# Patient Record
Sex: Male | Born: 1965 | Race: White | Hispanic: No | Marital: Single
Health system: Southern US, Community
[De-identification: ages and names within clinical notes are randomized; demographics above are authoritative.]

---

## 2001-06-23 ENCOUNTER — Encounter: Payer: Self-pay | Admitting: Emergency Medicine

## 2001-06-23 ENCOUNTER — Emergency Department (HOSPITAL_COMMUNITY): Admission: EM | Admit: 2001-06-23 | Discharge: 2001-06-23 | Payer: Self-pay | Admitting: Emergency Medicine

## 2002-05-27 ENCOUNTER — Encounter: Admission: RE | Admit: 2002-05-27 | Discharge: 2002-05-27 | Payer: Self-pay | Admitting: Internal Medicine

## 2002-05-27 ENCOUNTER — Encounter: Payer: Self-pay | Admitting: Internal Medicine

## 2006-12-18 ENCOUNTER — Emergency Department (HOSPITAL_COMMUNITY): Admission: EM | Admit: 2006-12-18 | Discharge: 2006-12-19 | Payer: Self-pay | Admitting: Emergency Medicine

## 2007-07-05 ENCOUNTER — Emergency Department (HOSPITAL_COMMUNITY): Admission: EM | Admit: 2007-07-05 | Discharge: 2007-07-05 | Payer: Self-pay | Admitting: Emergency Medicine

## 2009-01-18 ENCOUNTER — Emergency Department (HOSPITAL_COMMUNITY): Admission: EM | Admit: 2009-01-18 | Discharge: 2009-01-18 | Payer: Self-pay | Admitting: Family Medicine

## 2011-02-06 ENCOUNTER — Inpatient Hospital Stay (INDEPENDENT_AMBULATORY_CARE_PROVIDER_SITE_OTHER)
Admission: RE | Admit: 2011-02-06 | Discharge: 2011-02-06 | Disposition: A | Discharge status: Home / Self Care | Source: Ambulatory Visit | Attending: Family Medicine | Admitting: Family Medicine

## 2011-02-06 DIAGNOSIS — R6889 Other general symptoms and signs: Secondary | ICD-10-CM

## 2011-02-06 DIAGNOSIS — E875 Hyperkalemia: Secondary | ICD-10-CM

## 2011-02-06 LAB — POCT I-STAT, CHEM 8
BUN: 9 mg/dL (ref 6–23)
Calcium, Ion: 1.13 mmol/L (ref 1.12–1.32)
Chloride: 107 mEq/L (ref 96–112)
Creatinine, Ser: 1.1 mg/dL (ref 0.4–1.5)
Glucose, Bld: 102 mg/dL — ABNORMAL HIGH (ref 70–99)
HCT: 46 % (ref 39.0–52.0)
Hemoglobin: 15.6 g/dL (ref 13.0–17.0)
Potassium: 5.6 mEq/L — ABNORMAL HIGH (ref 3.5–5.1)
Sodium: 142 mEq/L (ref 135–145)
TCO2: 26 mmol/L (ref 0–100)

## 2011-02-14 ENCOUNTER — Emergency Department (HOSPITAL_COMMUNITY)
Admission: EM | Admit: 2011-02-14 | Discharge: 2011-02-14 | Disposition: A | Discharge status: Home / Self Care | Attending: Emergency Medicine | Admitting: Emergency Medicine

## 2011-02-14 ENCOUNTER — Emergency Department (HOSPITAL_COMMUNITY)

## 2011-02-14 DIAGNOSIS — R079 Chest pain, unspecified: Secondary | ICD-10-CM | POA: Insufficient documentation

## 2011-02-14 DIAGNOSIS — R0602 Shortness of breath: Secondary | ICD-10-CM | POA: Insufficient documentation

## 2011-02-14 DIAGNOSIS — F319 Bipolar disorder, unspecified: Secondary | ICD-10-CM | POA: Insufficient documentation

## 2011-02-14 DIAGNOSIS — R350 Frequency of micturition: Secondary | ICD-10-CM | POA: Insufficient documentation

## 2011-02-14 DIAGNOSIS — Z79899 Other long term (current) drug therapy: Secondary | ICD-10-CM | POA: Insufficient documentation

## 2011-02-14 LAB — POCT CARDIAC MARKERS
CKMB, poc: <1 ng/mL — ABNORMAL LOW (ref 1.0–8.0)
Myoglobin, poc: 45.4 ng/mL (ref 12–200)
Myoglobin, poc: 49.4 ng/mL (ref 12–200)

## 2011-02-14 LAB — DIFFERENTIAL
Basophils Absolute: 0 10*3/uL (ref 0.0–0.1)
Basophils Relative: 0 % (ref 0–1)
Eosinophils Absolute: 0.2 10*3/uL (ref 0.0–0.7)
Eosinophils Relative: 3 % (ref 0–5)
Lymphocytes Relative: 30 % (ref 12–46)
Lymphs Abs: 1.9 10*3/uL (ref 0.7–4.0)
Monocytes Absolute: 0.4 10*3/uL (ref 0.1–1.0)
Monocytes Relative: 6 % (ref 3–12)
Neutro Abs: 3.9 10*3/uL (ref 1.7–7.7)
Neutrophils Relative %: 60 % (ref 43–77)

## 2011-02-14 LAB — URINALYSIS, ROUTINE W REFLEX MICROSCOPIC
Bilirubin Urine: NEGATIVE
Hgb urine dipstick: NEGATIVE
Specific Gravity, Urine: 1.002 — ABNORMAL LOW (ref 1.005–1.030)
Urobilinogen, UA: 0.2 mg/dL (ref 0.0–1.0)

## 2011-02-14 LAB — CBC
MCHC: 35.4 g/dL (ref 30.0–36.0)
Platelets: 165 10*3/uL (ref 150–400)
RDW: 12.5 % (ref 11.5–15.5)

## 2011-02-14 LAB — BASIC METABOLIC PANEL
BUN: 14 mg/dL (ref 6–23)
CO2: 29 mEq/L (ref 19–32)
Calcium: 9.9 mg/dL (ref 8.4–10.5)
Glucose, Bld: 89 mg/dL (ref 70–99)
Sodium: 139 mEq/L (ref 135–145)

## 2011-03-28 ENCOUNTER — Emergency Department (HOSPITAL_COMMUNITY)
Admission: EM | Admit: 2011-03-28 | Discharge: 2011-03-28 | Disposition: A | Discharge status: Other Acute Inpatient Hospital | Payer: Non-veteran care | Attending: Emergency Medicine | Admitting: Emergency Medicine

## 2011-03-28 DIAGNOSIS — R4182 Altered mental status, unspecified: Secondary | ICD-10-CM | POA: Insufficient documentation

## 2011-03-28 DIAGNOSIS — F319 Bipolar disorder, unspecified: Secondary | ICD-10-CM | POA: Insufficient documentation

## 2011-03-28 LAB — URINALYSIS, ROUTINE W REFLEX MICROSCOPIC
Bilirubin Urine: NEGATIVE
Glucose, UA: NEGATIVE mg/dL
Ketones, ur: NEGATIVE mg/dL
Leukocytes, UA: NEGATIVE
pH: 5.5 (ref 5.0–8.0)

## 2011-03-28 LAB — DIFFERENTIAL
Basophils Absolute: 0 10*3/uL (ref 0.0–0.1)
Basophils Relative: 0 % (ref 0–1)
Monocytes Relative: 5 % (ref 3–12)
Neutro Abs: 4 10*3/uL (ref 1.7–7.7)
Neutrophils Relative %: 69 % (ref 43–77)

## 2011-03-28 LAB — COMPREHENSIVE METABOLIC PANEL
ALT: 10 U/L (ref 0–53)
AST: 15 U/L (ref 0–37)
CO2: 31 mEq/L (ref 19–32)
Chloride: 101 mEq/L (ref 96–112)
GFR calc non Af Amer: >60 mL/min (ref >60)
Potassium: 3.5 mEq/L (ref 3.5–5.1)
Sodium: 141 mEq/L (ref 135–145)
Total Bilirubin: 1.1 mg/dL (ref 0.3–1.2)

## 2011-03-28 LAB — CBC
Hemoglobin: 14.7 g/dL (ref 13.0–17.0)
RBC: 4.9 MIL/uL (ref 4.22–5.81)

## 2011-03-28 LAB — RAPID URINE DRUG SCREEN, HOSP PERFORMED
Amphetamines: NOT DETECTED
Barbiturates: NOT DETECTED
Benzodiazepines: NOT DETECTED
Cocaine: NOT DETECTED

## 2011-03-28 LAB — ACETAMINOPHEN LEVEL: Acetaminophen (Tylenol), Serum: <15 ug/mL (ref 10–30)

## 2013-04-09 IMAGING — CR DG CHEST 2V
2 series · 2 of 2 positions shown · non-contrast
Comparison: Chest radiograph performed 12/18/2006

CLINICAL DATA: Mid chest pain.

CHEST - 2 VIEW

[w chest pa]
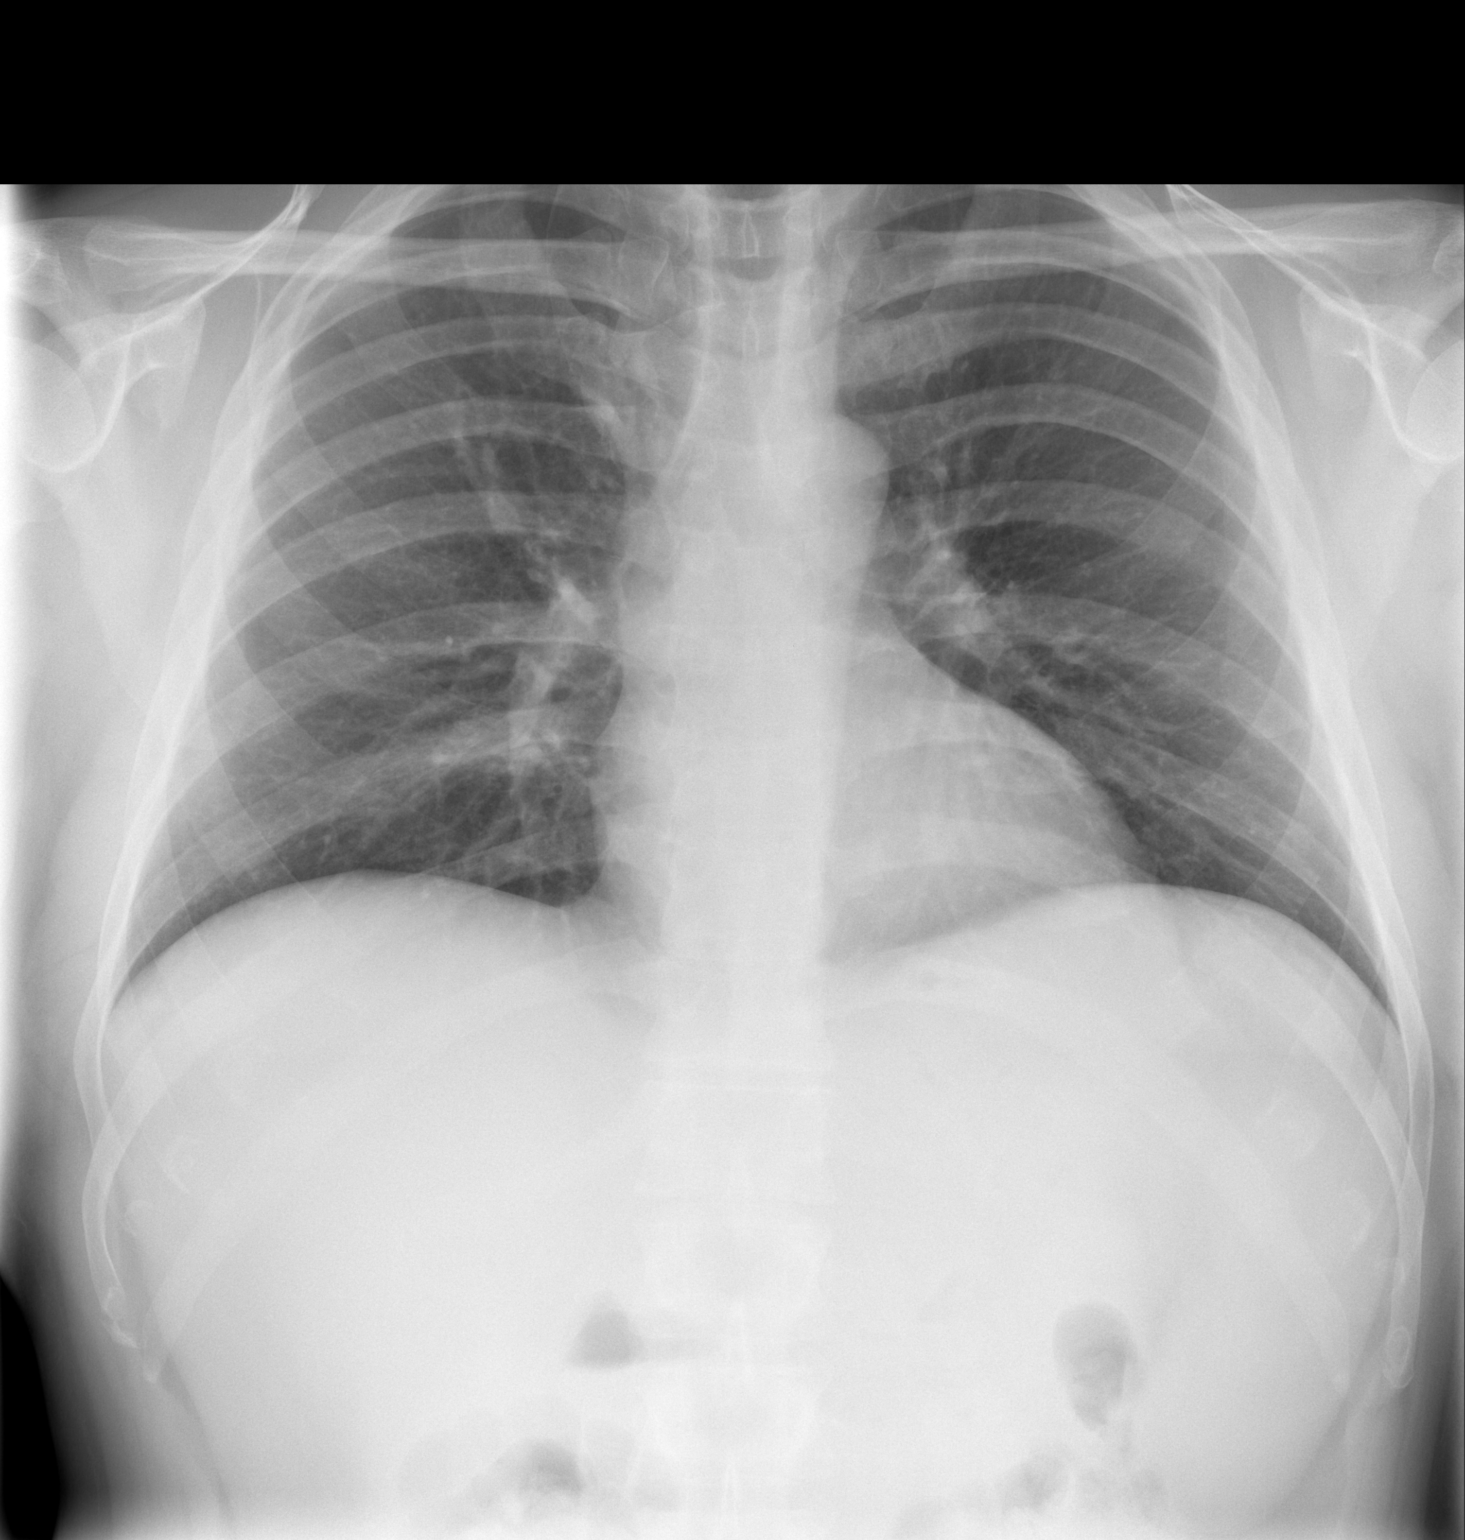

[w chest lat]
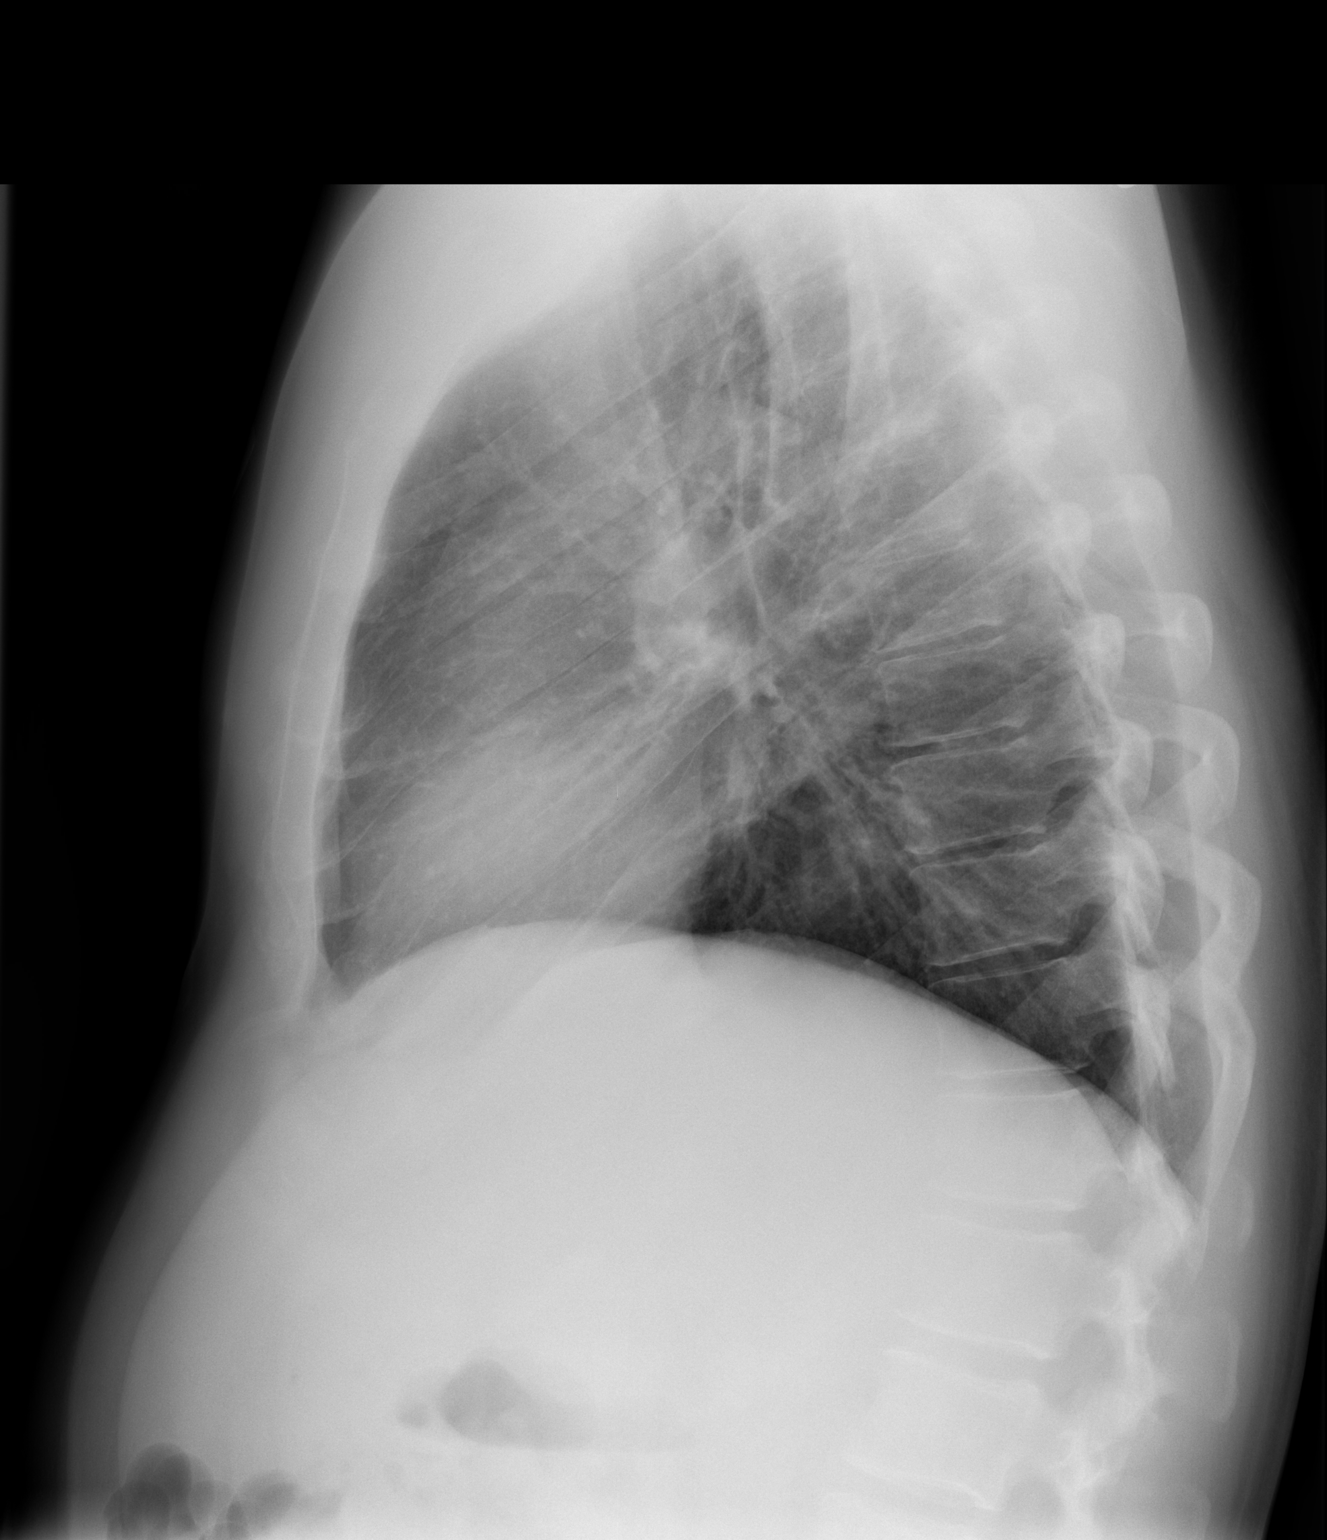

[2 of 2 positions shown; findings below may reference images not displayed]

FINDINGS: The lungs are well-aerated and clear.  There is no
evidence of focal opacification, pleural effusion or pneumothorax.
A small accessory azygos lobe is incidentally noted.

The heart is normal in size; the mediastinal contour is within
normal limits.  No acute osseous abnormalities are seen.
IMPRESSION: No acute cardiopulmonary process seen.

## 2015-10-12 ENCOUNTER — Encounter (HOSPITAL_COMMUNITY): Payer: Self-pay | Admitting: Emergency Medicine

## 2015-10-12 ENCOUNTER — Emergency Department (HOSPITAL_COMMUNITY)
Admission: EM | Admit: 2015-10-12 | Discharge: 2015-10-13 | Disposition: A | Discharge status: Home / Self Care | Payer: Non-veteran care | Attending: Emergency Medicine | Admitting: Emergency Medicine

## 2015-10-12 DIAGNOSIS — F3112 Bipolar disorder, current episode manic without psychotic features, moderate: Secondary | ICD-10-CM | POA: Diagnosis present

## 2015-10-12 DIAGNOSIS — Z79899 Other long term (current) drug therapy: Secondary | ICD-10-CM | POA: Diagnosis not present

## 2015-10-12 DIAGNOSIS — F309 Manic episode, unspecified: Secondary | ICD-10-CM | POA: Diagnosis not present

## 2015-10-12 DIAGNOSIS — Z008 Encounter for other general examination: Secondary | ICD-10-CM | POA: Diagnosis present

## 2015-10-12 LAB — COMPREHENSIVE METABOLIC PANEL
ALT: 21 U/L (ref 17–63)
ANION GAP: 9 (ref 5–15)
AST: 30 U/L (ref 15–41)
Albumin: 4.5 g/dL (ref 3.5–5.0)
Alkaline Phosphatase: 71 U/L (ref 38–126)
BILIRUBIN TOTAL: 1 mg/dL (ref 0.3–1.2)
BUN: 15 mg/dL (ref 6–20)
CHLORIDE: 103 mmol/L (ref 101–111)
CO2: 29 mmol/L (ref 22–32)
Calcium: 9.3 mg/dL (ref 8.9–10.3)
Creatinine, Ser: 1.03 mg/dL (ref 0.61–1.24)
Glucose, Bld: 100 mg/dL — ABNORMAL HIGH (ref 65–99)
POTASSIUM: 4.1 mmol/L (ref 3.5–5.1)
Sodium: 141 mmol/L (ref 135–145)
TOTAL PROTEIN: 7.1 g/dL (ref 6.5–8.1)

## 2015-10-12 LAB — CBC WITH DIFFERENTIAL/PLATELET
Basophils Absolute: 0 10*3/uL (ref 0.0–0.1)
Basophils Relative: 0 %
EOS PCT: 2 %
Eosinophils Absolute: 0.1 10*3/uL (ref 0.0–0.7)
HEMATOCRIT: 43.4 % (ref 39.0–52.0)
Hemoglobin: 14.6 g/dL (ref 13.0–17.0)
LYMPHS PCT: 32 %
Lymphs Abs: 1.4 10*3/uL (ref 0.7–4.0)
MCH: 29.3 pg (ref 26.0–34.0)
MCHC: 33.6 g/dL (ref 30.0–36.0)
MCV: 87 fL (ref 78.0–100.0)
MONO ABS: 0.3 10*3/uL (ref 0.1–1.0)
MONOS PCT: 6 %
NEUTROS ABS: 2.6 10*3/uL (ref 1.7–7.7)
Neutrophils Relative %: 60 %
PLATELETS: 128 10*3/uL — AB (ref 150–400)
RBC: 4.99 MIL/uL (ref 4.22–5.81)
RDW: 13.1 % (ref 11.5–15.5)
WBC: 4.4 10*3/uL (ref 4.0–10.5)

## 2015-10-12 LAB — RAPID URINE DRUG SCREEN, HOSP PERFORMED
Amphetamines: NOT DETECTED
BENZODIAZEPINES: NOT DETECTED
Barbiturates: NOT DETECTED
Cocaine: NOT DETECTED
OPIATES: NOT DETECTED
Tetrahydrocannabinol: NOT DETECTED

## 2015-10-12 LAB — ETHANOL

## 2015-10-12 MED ORDER — ALUM & MAG HYDROXIDE-SIMETH 200-200-20 MG/5ML PO SUSP: 30.0000 mL | ORAL | Status: DC | PRN

## 2015-10-12 MED ORDER — QUETIAPINE FUMARATE 100 MG PO TABS
200.0000 mg | ORAL_TABLET | Freq: Every day | ORAL | Status: DC
  Administered 2015-10-12: 200 mg via ORAL
  Filled 2015-10-12: qty 2

## 2015-10-12 MED ORDER — LORAZEPAM 1 MG PO TABS
1.0000 mg | ORAL_TABLET | Freq: Three times a day (TID) | ORAL | Status: DC | PRN
  Administered 2015-10-12: 1 mg via ORAL
  Filled 2015-10-12: qty 1

## 2015-10-12 MED ORDER — ZOLPIDEM TARTRATE 5 MG PO TABS: 5.0000 mg | ORAL_TABLET | Freq: Every evening | ORAL | Status: DC | PRN

## 2015-10-12 MED ORDER — NICOTINE 21 MG/24HR TD PT24
21.0000 mg | MEDICATED_PATCH | Freq: Every day | TRANSDERMAL | Status: DC
  Filled 2015-10-12 (×2): qty 1

## 2015-10-12 MED ORDER — CLONAZEPAM 0.5 MG PO TABS
0.5000 mg | ORAL_TABLET | Freq: Every day | ORAL | Status: DC
  Administered 2015-10-12: 0.5 mg via ORAL
  Filled 2015-10-12: qty 1

## 2015-10-12 MED ORDER — ONDANSETRON HCL 4 MG PO TABS: 4.0000 mg | ORAL_TABLET | Freq: Three times a day (TID) | ORAL | Status: DC | PRN

## 2015-10-12 NOTE — ED Notes (Signed)
Per EMS: Pt was standing on the corner of the street without any pants.  A&O x 4. Has stopped taking seroquel.  Requesting his meds be adjusted.

## 2015-10-12 NOTE — BH Assessment (Signed)
B[facility] Assessment Progress Note  The following facilities have been contacted to seek placement for this pt, with results as noted:  Beds available, information sent, decision pending:  [facility]   At capacity:  [facility]   Yordin Rhoda, MA Triage Specialist [phone]     

## 2015-10-12 NOTE — BH Assessment (Signed)
Pending review for possible placement with [facility].  Writer called [facility] ([phone]) to check the status of their bed availability. Was informed by the AOD they were NOT on diversion. Thus, patient can not be placed with [facility] at this time.  Writer informed [facility] ER Staff Julieanne Cotton, NP)

## 2015-10-12 NOTE — Consult Note (Signed)
[facility] Face-to-Face Psychiatry Consult   Reason for Consult:  Bizarre behavior, manic symptoms Referring Physician:  EDP Patient Identification: Andrew Schroeder MRN:  [id number] Principal Diagnosis: Bipolar 1 disorder, manic, moderate ([place]) Diagnosis:   Patient Active Problem List   Diagnosis Date Noted  . Bipolar 1 disorder, manic, moderate ([facility]) [F31.12] 10/12/2015    Priority: High    Total Time spent with patient: 45 minutes  Subjective:   Andrew Schroeder is a 50 y.o. male patient admitted with  Bizarre behavior, manic symptoms  HPI:  Caucasian male, 50 years old was evaluated today after he was brought in by EMS.  Patient was picked up by EMS when he was seen standing on the street with his pant down.  Patient admitted that his pant was down when he was found but could not recall why he was naked.  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  He reports that he has not had his medications in 2 weeks because he was moving from [place] to [place].  Patient reports poor sleep since not taking his medications.  Patient reports that he has been engaging in "grounding therapy" for his Bipolar treatment.  He denies SI/HI/AVH.  He has been accepted for admission and we will be seeking placement at any facility with available bed.  Past Psychiatric History: Bipolar disorder  Risk to Self:   Risk to Others:   Prior Inpatient Therapy:   Prior Outpatient Therapy:    Past Medical History: History reviewed. No pertinent past medical history. No past surgical history on file. Family History: No family history on file.   Family Psychiatric  History:  Unknown Social History:  History  Alcohol Use No     History  Drug Use Not on file    Social History   Social History  . Marital Status: Single    Spouse Name: N/A  . Number of Children: N/A  . Years of Education: N/A   Social History Main Topics  . Smoking status: None  . Smokeless  tobacco: None  . Alcohol Use: No  . Drug Use: None  . Sexual Activity: Not Asked   Other Topics Concern  . None   Social History Narrative  . None   Additional Social History:  Allergies:  No Known Allergies  Labs:  Results for orders placed or performed during the hospital encounter of 10/12/15 (from the past 48 hour(s))  Urine rapid drug screen (hosp performed)     Status: None   Collection Time: 10/12/15  9:43 AM  Result Value Ref Range   Opiates NONE DETECTED NONE DETECTED   Cocaine NONE DETECTED NONE DETECTED   Benzodiazepines NONE DETECTED NONE DETECTED   Amphetamines NONE DETECTED NONE DETECTED   Tetrahydrocannabinol NONE DETECTED NONE DETECTED   Barbiturates NONE DETECTED NONE DETECTED    Comment:        DRUG SCREEN FOR MEDICAL PURPOSES ONLY.  IF CONFIRMATION IS NEEDED FOR ANY PURPOSE, NOTIFY LAB WITHIN 5 DAYS.        LOWEST DETECTABLE LIMITS FOR URINE DRUG SCREEN Drug Class       Cutoff (ng/mL) Amphetamine      1000 Barbiturate      200 Benzodiazepine   183 Tricyclics       437 Opiates          300 Cocaine          300 THC  50   CBC with Differential/Platelet     Status: Abnormal   Collection Time: 10/12/15  9:46 AM  Result Value Ref Range   WBC 4.4 4.0 - 10.5 K/uL   RBC 4.99 4.22 - 5.81 MIL/uL   Hemoglobin 14.6 13.0 - 17.0 g/dL   HCT 43.4 39.0 - 52.0 %   MCV 87.0 78.0 - 100.0 fL   MCH 29.3 26.0 - 34.0 pg   MCHC 33.6 30.0 - 36.0 g/dL   RDW 13.1 11.5 - 15.5 %   Platelets 128 (L) 150 - 400 K/uL   Neutrophils Relative % 60 %   Neutro Abs 2.6 1.7 - 7.7 K/uL   Lymphocytes Relative 32 %   Lymphs Abs 1.4 0.7 - 4.0 K/uL   Monocytes Relative 6 %   Monocytes Absolute 0.3 0.1 - 1.0 K/uL   Eosinophils Relative 2 %   Eosinophils Absolute 0.1 0.0 - 0.7 K/uL   Basophils Relative 0 %   Basophils Absolute 0.0 0.0 - 0.1 K/uL  Comprehensive metabolic panel     Status: Abnormal   Collection Time: 10/12/15  9:46 AM  Result Value Ref Range   Sodium  141 135 - 145 mmol/L   Potassium 4.1 3.5 - 5.1 mmol/L   Chloride 103 101 - 111 mmol/L   CO2 29 22 - 32 mmol/L   Glucose, Bld 100 (H) 65 - 99 mg/dL   BUN 15 6 - 20 mg/dL   Creatinine, Ser 1.03 0.61 - 1.24 mg/dL   Calcium 9.3 8.9 - 10.3 mg/dL   Total Protein 7.1 6.5 - 8.1 g/dL   Albumin 4.5 3.5 - 5.0 g/dL   AST 30 15 - 41 U/L   ALT 21 17 - 63 U/L   Alkaline Phosphatase 71 38 - 126 U/L   Total Bilirubin 1.0 0.3 - 1.2 mg/dL   GFR calc non Af Amer >60 >60 mL/min   GFR calc Af Amer >60 >60 mL/min    Comment: (NOTE) The eGFR has been calculated using the CKD EPI equation. This calculation has not been validated in all clinical situations. eGFR's persistently <60 mL/min signify possible Chronic Kidney Disease.    Anion gap 9 5 - 15  Ethanol     Status: None   Collection Time: 10/12/15  9:46 AM  Result Value Ref Range   Alcohol, Ethyl (B) <5 <5 mg/dL    Comment:        LOWEST DETECTABLE LIMIT FOR SERUM ALCOHOL IS 5 mg/dL FOR MEDICAL PURPOSES ONLY     Current Facility-Administered Medications  Medication Dose Route Frequency Provider Last Rate Last Dose  . alum & mag hydroxide-simeth (MAALOX/MYLANTA) 200-200-20 MG/5ML suspension 30 mL  30 mL Oral PRN Lacretia Leigh, MD      . clonazePAM [facility]) tablet 0.5 mg  0.5 mg Oral QHS Xareni Kelch      . LORazepam (ATIVAN) tablet 1 mg  1 mg Oral Q8H PRN Lacretia Leigh, MD      . nicotine (NICODERM CQ - dosed in mg/24 hours) patch 21 mg  21 mg Transdermal Daily Lacretia Leigh, MD   21 mg at 10/12/15 1232  . ondansetron (ZOFRAN) tablet 4 mg  4 mg Oral Q8H PRN Lacretia Leigh, MD      . QUEtiapine (SEROQUEL) tablet 200 mg  200 mg Oral QHS Racer Quam       Current Outpatient Prescriptions  Medication Sig Dispense Refill  . clonazePAM (KLONOPIN) 0.5 MG tablet Take 0.5-1 mg by mouth at bedtime  as needed for anxiety.    Marland Kitchen QUEtiapine (SEROQUEL XR) 200 MG 24 hr tablet Take 200 mg by mouth at bedtime.      Musculoskeletal: Strength & Muscle  Tone: within normal limits Gait & Station: normal Patient leans: N/A  Psychiatric Specialty Exam: Review of Systems  Constitutional: Negative.   HENT: Negative.   Eyes: Negative.   Respiratory: Negative.   Cardiovascular: Negative.   Gastrointestinal: Negative.   Genitourinary: Negative.   Musculoskeletal: Negative.   Skin: Negative.   Neurological: Negative.   Endo/Heme/Allergies: Negative.     Blood pressure 131/69, pulse 77, temperature 98 F (36.7 C), temperature source Oral, resp. rate 18, SpO2 100 %.There is no height or weight on file to calculate BMI.  General Appearance: Casual and Fairly Groomed  Engineer, water::  Good  Speech:  Clear and Coherent and Normal Rate  Volume:  Normal  Mood:  Anxious  Affect:  Congruent  Thought Process:  Coherent, Goal Directed and Intact  Orientation:  Full (Time, Place, and Person)  Thought Content:  WDL  Suicidal Thoughts:  No  Homicidal Thoughts:  No  Memory:  Immediate;   Good Recent;   Good Remote;   Good  Judgement:  Fair  Insight:  Fair  Psychomotor Activity:  Normal  Concentration:  Good  Recall:  Icard of Knowledge:Fair  Language: Good  Akathisia:  No  Handed:  Right  AIMS (if indicated):     Assets:  Desire for Improvement  ADL's:  Intact  Cognition: WNL  Sleep:       Disposition: Accepted for admission and we will be seeking placement at any facility with available bed.  We have resumed his home medications.  Delfin Gant   PMHNP-BC 10/12/2015 2:09 PM Patient seen face-to-face for psychiatric evaluation, chart reviewed and case discussed with the physician extender and developed treatment plan. Reviewed the information documented and agree with the treatment plan. Corena Pilgrim, MD

## 2015-10-12 NOTE — BH Assessment (Signed)
BHH Assessment Progress Note  Per Thedore Mins, MD, this pt requires psychiatric hospitalization at this time.  Pt is a veteran and reports that he has been to the [facility] in the past.  At 12:03 I spoke to Hoy Register [phone]) at the [facility].  After taking demographic information, he reports that pt will be placed on their wait list and that they will call when a bed becomes available.  At this time they will not accept a referral packet by fax.  Doylene Canning, MA Triage Specialist [phone]

## 2015-10-12 NOTE — ED Notes (Signed)
Patient appears euphoric. Denies complaint. Refers to questions of anxiety and depression as "Western problems" States [place]. Denies SI, HI, AVH. Denies any anxiety or depression. Reports no recent changes in sleep or appetite.

## 2015-10-12 NOTE — Progress Notes (Signed)
Pt is a 50 y/o Caucasian male admitted to [facility] with h/o of Bipolar D/O. Per chart and report, pt was brought in by EMS after he was found naked, standing by a telephone pole. Pt was cooperative with initial nursing assessment. Denied SI, HI, AVH and pain when assessed. Reported medication noncompliance with Seroquel. Per pt, he has not taken his medication because he's been traveling ([place] Pt also stated to writer that he's been sleeping 3 hours on average per night as he's been out of his Klonopin as well.Safety maintained on Q 15 minutes checks as ordered without self injurious behavior or outburst to report at this time.

## 2015-10-12 NOTE — ED Notes (Signed)
Waiting to draw blood, pt in bathroom

## 2015-10-12 NOTE — Progress Notes (Signed)
Patient listed as having VA benefits without a pcp.  EDCM spoke to patient at bedside.  When [facility] questioned patient if he associated with the VA, patient responded, "Well, I've been in the [place] for the past three years so not right now."  Patient reports he was seen at the [facility] four years ago.  EDCM offered patient community resources for assistance finding a pcp or medication assistance, but patient refused.  Patient reports he will follow up with his pcp Dr. Geanie Cooley? At [facility].  Patient also asked EDCM how he would get a visiting RN through the [place].  [facility]  Informed patient that he must first make an appointment with his pcp at the [place] and then ask to speak to a case manager.  Patient is aware that he may not qualify for home health RN.  Patient thanked [facility] for services.  No further EDCM needs at this time.

## 2015-10-12 NOTE — ED Notes (Signed)
Pt states that [place] has been living [place] the past 3 years.  States that his rx of seroquel ran out within the last 2 months.  States that he was standing out on the corner without any pants.  Hx of bipolar.  Just wants rx for seroquel.

## 2015-10-12 NOTE — ED Provider Notes (Signed)
CSN: [id number]     Arrival date & time 10/12/15  0844 History   First MD Initiated Contact with Patient 10/12/15 [phone]     Chief Complaint  Patient presents with  . Medical Clearance     (Consider location/radiation/quality/duration/timing/severity/associated sxs/prior Treatment) HPI Comments: Patient here after being found outside standing by telephone pole without any pants on. Patient denies any auditory or visual hallucinations. States that he does have a history of bipolar disorder and has been out of his Seroquel and has been experiencing more mania. He has tried self calming techniques which have not helped. Denies any suicidal or homicidal ideations. No somatic complaints at this time. States he did take 2 Klonopin because he thought he was comfortable going to sleep. Spoke with EMS, when they arrived he was calm alert and cooperative. Patient transported here for further management  The history is provided by the patient.    History reviewed. No pertinent past medical history. No past surgical history on file. No family history on file. Social History  Substance Use Topics  . Smoking status: None  . Smokeless tobacco: None  . Alcohol Use: No    Review of Systems  All other systems reviewed and are negative.     Allergies  Review of patient's allergies indicates no known allergies.  Home Medications   Prior to Admission medications   Medication Sig Start Date End Date Taking? Authorizing Provider  QUEtiapine (SEROQUEL XR) 200 MG 24 hr tablet Take 200 mg by mouth at bedtime.   Yes Historical Provider, MD   BP 140/83 mmHg  Pulse 72  Temp(Src) 97.9 F (36.6 C) (Oral)  Resp 20  SpO2 100% Physical Exam  Constitutional: He is oriented to person, place, and time. He appears well-developed and well-nourished.  Non-toxic appearance. No distress.  HENT:  Head: Normocephalic and atraumatic.  Eyes: Conjunctivae, EOM and lids are normal. Pupils are equal, round, and  reactive to light.  Neck: Normal range of motion. Neck supple. No tracheal deviation present. No thyroid mass present.  Cardiovascular: Normal rate, regular rhythm and normal heart sounds.  Exam reveals no gallop.   No murmur heard. Pulmonary/Chest: Effort normal and breath sounds normal. No stridor. No respiratory distress. He has no decreased breath sounds. He has no wheezes. He has no rhonchi. He has no rales.  Abdominal: Soft. Normal appearance and bowel sounds are normal. He exhibits no distension. There is no tenderness. There is no rebound and no CVA tenderness.  Musculoskeletal: Normal range of motion. He exhibits no edema or tenderness.  Neurological: He is alert and oriented to person, place, and time. He has normal strength. No cranial nerve deficit or sensory deficit. GCS eye subscore is 4. GCS verbal subscore is 5. GCS motor subscore is 6.  Skin: Skin is warm and dry. No abrasion and no rash noted.  Psychiatric: His speech is normal and behavior is normal. His affect is blunt. Thought content is not delusional. He expresses no suicidal plans and no homicidal plans.  Nursing note and vitals reviewed.   ED Course  Procedures (including critical care time) Labs Review Labs Reviewed  CBC WITH DIFFERENTIAL/PLATELET  COMPREHENSIVE METABOLIC PANEL  ETHANOL  URINE RAPID DRUG SCREEN, HOSP PERFORMED    Imaging Review No results found. I have personally reviewed and evaluated these images and lab results as part of my medical decision-making.   EKG Interpretation None      MDM   Final diagnoses:  None    Patient  has been seen by psychiatry and has agreed to come in for mood stabilization    Lorre Nick, MD 10/12/15 1046

## 2015-10-13 MED ORDER — QUETIAPINE FUMARATE ER 200 MG PO TB24: 200.0000 mg | ORAL_TABLET | Freq: Every day | ORAL | Status: AC

## 2015-10-13 MED ORDER — QUETIAPINE FUMARATE ER 200 MG PO TB24: 200.0000 mg | ORAL_TABLET | Freq: Every day | ORAL | Status: DC

## 2015-10-13 MED ORDER — CLONAZEPAM 0.5 MG PO TABS: 0.5000 mg | ORAL_TABLET | Freq: Every day | ORAL | Status: AC

## 2015-10-13 NOTE — BH Assessment (Signed)
BHH Assessment Progress Note  Per Thedore Mins, MD, this pt does not require psychiatric hospitalization at this time.  Pt is to be discharged from [facility] with referral information for the [facility] clinic.  This information has been included in pt's discharge instructions.  Pt's nurse, Dawnaly, has been notified.  Doylene Canning, MA Triage Specialist [phone]

## 2015-10-13 NOTE — ED Notes (Signed)
MD at bedside. 

## 2015-10-13 NOTE — Consult Note (Signed)
[facility] Face-to-Face Psychiatry Consult   Reason for Consult:  Bizarre behavior, manic symptoms Referring Physician:  EDP Patient Identification: Andrew Schroeder MRN:  [id number] Principal Diagnosis: Bipolar 1 disorder, manic, moderate ([place]) Diagnosis:   Patient Active Problem List   Diagnosis Date Noted  . Bipolar 1 disorder, manic, moderate ([place]) [F31.12] 10/12/2015    Priority: High    Total Time spent with patient: 45 minutes  Subjective:   Andrew Schroeder is a 50 y.o. male patient admitted with  Bizarre behavior, manic symptoms. Pt spent the night in the ED without incident and has been appropriate. Denies suicidal/homicidal ideation and psychosis and does not appear to be responding to internal stimuli. Pt seen and chart reviewed by NP/MD team and note written on behalf of Dr. Darleene Cleaver. Pt deemed stable for discharge today.   HPI:  Caucasian male, 50 years old was evaluated today after he was brought in by EMS.  Patient was picked up by EMS when he was seen standing on the street with his pant down.  Patient admitted that his pant was down when he was found but could not recall why he was naked.  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  He reports that he has not had his medications in 2 weeks because he was moving from [place] to [place].  Patient reports poor sleep since not taking his medications.  Patient reports that he has been engaging in "grounding therapy" for his Bipolar treatment.  He denies SI/HI/AVH.  He has been accepted for admission and we will be seeking placement at any facility with available bed.  Past Psychiatric History: Bipolar disorder  Risk to Self:   Risk to Others:   Prior Inpatient Therapy:   Prior Outpatient Therapy:    Past Medical History: History reviewed. No pertinent past medical history. No past surgical history on file. Family History: No family history on file.   Family Psychiatric  History:   Unknown Social History:  History  Alcohol Use No     History  Drug Use Not on file    Social History   Social History  . Marital Status: Single    Spouse Name: N/A  . Number of Children: N/A  . Years of Education: N/A   Social History Main Topics  . Smoking status: None  . Smokeless tobacco: None  . Alcohol Use: No  . Drug Use: None  . Sexual Activity: Not Asked   Other Topics Concern  . None   Social History Narrative  . None   Additional Social History:  Allergies:  No Known Allergies  Labs:  Results for orders placed or performed during the hospital encounter of 10/12/15 (from the past 48 hour(s))  Urine rapid drug screen (hosp performed)     Status: None   Collection Time: 10/12/15  9:43 AM  Result Value Ref Range   Opiates NONE DETECTED NONE DETECTED   Cocaine NONE DETECTED NONE DETECTED   Benzodiazepines NONE DETECTED NONE DETECTED   Amphetamines NONE DETECTED NONE DETECTED   Tetrahydrocannabinol NONE DETECTED NONE DETECTED   Barbiturates NONE DETECTED NONE DETECTED    Comment:        DRUG SCREEN FOR MEDICAL PURPOSES ONLY.  IF CONFIRMATION IS NEEDED FOR ANY PURPOSE, NOTIFY LAB WITHIN 5 DAYS.        LOWEST DETECTABLE LIMITS FOR URINE DRUG SCREEN Drug Class       Cutoff (ng/mL) Amphetamine  1000 Barbiturate      200 Benzodiazepine   240 Tricyclics       973 Opiates          300 Cocaine          300 THC              50   CBC with Differential/Platelet     Status: Abnormal   Collection Time: 10/12/15  9:46 AM  Result Value Ref Range   WBC 4.4 4.0 - 10.5 K/uL   RBC 4.99 4.22 - 5.81 MIL/uL   Hemoglobin 14.6 13.0 - 17.0 g/dL   HCT 43.4 39.0 - 52.0 %   MCV 87.0 78.0 - 100.0 fL   MCH 29.3 26.0 - 34.0 pg   MCHC 33.6 30.0 - 36.0 g/dL   RDW 13.1 11.5 - 15.5 %   Platelets 128 (L) 150 - 400 K/uL   Neutrophils Relative % 60 %   Neutro Abs 2.6 1.7 - 7.7 K/uL   Lymphocytes Relative 32 %   Lymphs Abs 1.4 0.7 - 4.0 K/uL   Monocytes Relative 6 %    Monocytes Absolute 0.3 0.1 - 1.0 K/uL   Eosinophils Relative 2 %   Eosinophils Absolute 0.1 0.0 - 0.7 K/uL   Basophils Relative 0 %   Basophils Absolute 0.0 0.0 - 0.1 K/uL  Comprehensive metabolic panel     Status: Abnormal   Collection Time: 10/12/15  9:46 AM  Result Value Ref Range   Sodium 141 135 - 145 mmol/L   Potassium 4.1 3.5 - 5.1 mmol/L   Chloride 103 101 - 111 mmol/L   CO2 29 22 - 32 mmol/L   Glucose, Bld 100 (H) 65 - 99 mg/dL   BUN 15 6 - 20 mg/dL   Creatinine, Ser 1.03 0.61 - 1.24 mg/dL   Calcium 9.3 8.9 - 10.3 mg/dL   Total Protein 7.1 6.5 - 8.1 g/dL   Albumin 4.5 3.5 - 5.0 g/dL   AST 30 15 - 41 U/L   ALT 21 17 - 63 U/L   Alkaline Phosphatase 71 38 - 126 U/L   Total Bilirubin 1.0 0.3 - 1.2 mg/dL   GFR calc non Af Amer >60 >60 mL/min   GFR calc Af Amer >60 >60 mL/min    Comment: (NOTE) The eGFR has been calculated using the CKD EPI equation. This calculation has not been validated in all clinical situations. eGFR's persistently <60 mL/min signify possible Chronic Kidney Disease.    Anion gap 9 5 - 15  Ethanol     Status: None   Collection Time: 10/12/15  9:46 AM  Result Value Ref Range   Alcohol, Ethyl (B) <5 <5 mg/dL    Comment:        LOWEST DETECTABLE LIMIT FOR SERUM ALCOHOL IS 5 mg/dL FOR MEDICAL PURPOSES ONLY     Current Facility-Administered Medications  Medication Dose Route Frequency Provider Last Rate Last Dose  . alum & mag hydroxide-simeth (MAALOX/MYLANTA) 200-200-20 MG/5ML suspension 30 mL  30 mL Oral PRN Lacretia Leigh, MD      . clonazePAM [facility]) tablet 0.5 mg  0.5 mg Oral QHS Keno Caraway   0.5 mg at 10/12/15 2151  . LORazepam (ATIVAN) tablet 1 mg  1 mg Oral Q8H PRN Lacretia Leigh, MD   1 mg at 10/12/15 1748  . ondansetron (ZOFRAN) tablet 4 mg  4 mg Oral Q8H PRN Lacretia Leigh, MD      . QUEtiapine (SEROQUEL) tablet 200 mg  200 mg Oral QHS Auriah Hollings   200 mg at 10/12/15 2151   Current Outpatient Prescriptions  Medication Sig  Dispense Refill  . clonazePAM (KLONOPIN) 0.5 MG tablet Take 0.5-1 mg by mouth at bedtime as needed for anxiety.    Marland Kitchen QUEtiapine (SEROQUEL XR) 200 MG 24 hr tablet Take 1 tablet (200 mg total) by mouth at bedtime.      Musculoskeletal: Strength & Muscle Tone: within normal limits Gait & Station: normal Patient leans: N/A  Psychiatric Specialty Exam: Review of Systems  Constitutional: Negative.   HENT: Negative.   Eyes: Negative.   Respiratory: Negative.   Cardiovascular: Negative.   Gastrointestinal: Negative.   Genitourinary: Negative.   Musculoskeletal: Negative.   Skin: Negative.   Neurological: Negative.   Endo/Heme/Allergies: Negative.   All other systems reviewed and are negative.   Blood pressure 140/73, pulse 79, temperature 98.4 F (36.9 C), temperature source Oral, resp. rate 20, SpO2 99 %.There is no height or weight on file to calculate BMI.  General Appearance: Casual and Fairly Groomed  Engineer, water::  Good  Speech:  Clear and Coherent and Normal Rate  Volume:  Normal  Mood:  Anxious  Affect:  Congruent  Thought Process:  Coherent, Goal Directed and Intact  Orientation:  Full (Time, Place, and Person)  Thought Content:  WDL  Suicidal Thoughts:  No  Homicidal Thoughts:  No  Memory:  Immediate;   Good Recent;   Good Remote;   Good  Judgement:  Fair  Insight:  Fair  Psychomotor Activity:  Normal  Concentration:  Good  Recall:  [place] of Knowledge:Fair  Language: Good  Akathisia:  No  Handed:  Right  AIMS (if indicated):     Assets:  Desire for Improvement  ADL's:  Intact  Cognition: WNL  Sleep:      Disposition:  -Discharge home with Rx for Clonazepam and Seroquel as listed on chart.   Benjamine Mola, FNP-BC 10/13/2015 12:39 PM  Patient seen face-to-face for psychiatric evaluation, chart reviewed and case discussed with the physician extender and developed treatment plan. Reviewed the information documented and agree with the treatment  plan. Corena Pilgrim, MD

## 2015-10-13 NOTE — Discharge Instructions (Signed)
For your ongoing behavioral health needs, you are advised to follow up with the [facility]:       [facility]      [address] [place], [place] [address]      [phone]

## 2015-10-13 NOTE — ED Notes (Signed)
Patient discharged to home.  Left the unit ambulatory with all belongings.  Denies thoughts of harm to self or others.  Denies auditory or visual hallucinations.

## 2015-10-13 NOTE — BH Assessment (Signed)
[facility] Assessment Progress Note  At 09:37 this Clinical research associate called the [facility] to follow up on yesterday's referral call that I placed.  I spoke to Crystal at [phone].  After doing some searching, she reports that they have retained preliminary information, but that there are thirteen patients waiting in line before this patient, and they are still not willing to accept a referral packet.  Moreover, she reports that the [place] [facility] in [place] and [place] remain at capacity; she is not aware of the status of the [facility].  She does not, however, authorize referral to a private facility under [place] benefits.  She will contact me when they are ready to discuss referral.  At 09:43 I called the [facility] and spoke to [place].  She reports that their facility is at capacity.  Doylene Canning, MA Triage Specialist [phone]

## 2015-10-26 DEATH — deceased
